# Patient Record
Sex: Female | Born: 1950 | Smoking: Never smoker
Health system: Southern US, Community
[De-identification: ages and names within clinical notes are randomized; demographics above are authoritative.]

## PROBLEM LIST (undated history)

## (undated) DIAGNOSIS — R739 Hyperglycemia, unspecified: Secondary | ICD-10-CM

## (undated) DIAGNOSIS — K5900 Constipation, unspecified: Secondary | ICD-10-CM

## (undated) DIAGNOSIS — M7061 Trochanteric bursitis, right hip: Secondary | ICD-10-CM

## (undated) DIAGNOSIS — F32A Depression, unspecified: Secondary | ICD-10-CM

## (undated) DIAGNOSIS — R5383 Other fatigue: Secondary | ICD-10-CM

## (undated) DIAGNOSIS — N390 Urinary tract infection, site not specified: Secondary | ICD-10-CM

## (undated) DIAGNOSIS — M7071 Other bursitis of hip, right hip: Secondary | ICD-10-CM

## (undated) DIAGNOSIS — I10 Essential (primary) hypertension: Secondary | ICD-10-CM

## (undated) DIAGNOSIS — J302 Other seasonal allergic rhinitis: Secondary | ICD-10-CM

## (undated) DIAGNOSIS — M1990 Unspecified osteoarthritis, unspecified site: Secondary | ICD-10-CM

## (undated) DIAGNOSIS — E119 Type 2 diabetes mellitus without complications: Secondary | ICD-10-CM

## (undated) DIAGNOSIS — M5440 Lumbago with sciatica, unspecified side: Secondary | ICD-10-CM

## (undated) DIAGNOSIS — G4700 Insomnia, unspecified: Secondary | ICD-10-CM

## (undated) DIAGNOSIS — K219 Gastro-esophageal reflux disease without esophagitis: Secondary | ICD-10-CM

## (undated) HISTORY — DX: Lumbago with sciatica, unspecified side: M54.40

## (undated) HISTORY — DX: Urinary tract infection, site not specified: N39.0

## (undated) HISTORY — DX: Gastro-esophageal reflux disease without esophagitis: K21.9

## (undated) HISTORY — DX: Other bursitis of hip, right hip: M70.71

## (undated) HISTORY — DX: Other fatigue: R53.83

## (undated) HISTORY — DX: Hyperglycemia, unspecified: R73.9

## (undated) HISTORY — DX: Other seasonal allergic rhinitis: J30.2

## (undated) HISTORY — DX: Unspecified osteoarthritis, unspecified site: M19.90

## (undated) HISTORY — DX: Insomnia, unspecified: G47.00

## (undated) HISTORY — DX: Depression, unspecified: F32.A

## (undated) HISTORY — DX: Morbid (severe) obesity due to excess calories: E66.01

## (undated) HISTORY — DX: Constipation, unspecified: K59.00

## (undated) HISTORY — DX: Trochanteric bursitis, right hip: M70.61

---

## 1997-12-21 ENCOUNTER — Encounter: Admission: RE | Admit: 1997-12-21 | Discharge: 1997-12-21 | Payer: Self-pay | Admitting: Internal Medicine

## 1997-12-29 ENCOUNTER — Encounter: Admission: RE | Admit: 1997-12-29 | Discharge: 1998-03-29 | Payer: Self-pay | Admitting: Internal Medicine

## 1998-08-04 ENCOUNTER — Encounter: Admission: RE | Admit: 1998-08-04 | Discharge: 1998-08-04 | Payer: Self-pay | Admitting: Family Medicine

## 1998-08-05 ENCOUNTER — Encounter
Admission: RE | Admit: 1998-08-05 | Discharge: 1998-09-07 | Payer: Self-pay | Admitting: Physical Medicine and Rehabilitation

## 1998-09-01 ENCOUNTER — Encounter: Admission: RE | Admit: 1998-09-01 | Discharge: 1998-09-01 | Payer: Self-pay | Admitting: Family Medicine

## 1998-10-11 ENCOUNTER — Encounter: Admission: RE | Admit: 1998-10-11 | Discharge: 1998-10-11 | Payer: Self-pay | Admitting: Sports Medicine

## 1998-10-12 ENCOUNTER — Encounter: Admission: RE | Admit: 1998-10-12 | Discharge: 1998-10-12 | Payer: Self-pay | Admitting: Family Medicine

## 1998-11-09 ENCOUNTER — Encounter: Admission: RE | Admit: 1998-11-09 | Discharge: 1998-11-09 | Payer: Self-pay | Admitting: Family Medicine

## 1998-12-08 ENCOUNTER — Encounter: Admission: RE | Admit: 1998-12-08 | Discharge: 1998-12-08 | Payer: Self-pay | Admitting: Family Medicine

## 2000-06-20 ENCOUNTER — Encounter: Payer: Self-pay | Admitting: Internal Medicine

## 2000-06-20 ENCOUNTER — Encounter: Admission: RE | Admit: 2000-06-20 | Discharge: 2000-06-20 | Payer: Self-pay | Admitting: Internal Medicine

## 2001-06-23 ENCOUNTER — Encounter: Payer: Self-pay | Admitting: *Deleted

## 2001-06-23 ENCOUNTER — Encounter: Admission: RE | Admit: 2001-06-23 | Discharge: 2001-06-23 | Payer: Self-pay | Admitting: *Deleted

## 2002-11-17 ENCOUNTER — Other Ambulatory Visit: Admission: RE | Admit: 2002-11-17 | Discharge: 2002-11-17 | Payer: Self-pay | Admitting: Family Medicine

## 2002-12-11 ENCOUNTER — Ambulatory Visit (HOSPITAL_COMMUNITY): Admission: RE | Admit: 2002-12-11 | Discharge: 2002-12-11 | Payer: Self-pay | Admitting: Family Medicine

## 2011-08-23 ENCOUNTER — Ambulatory Visit: Payer: Self-pay

## 2015-11-08 ENCOUNTER — Other Ambulatory Visit: Payer: Self-pay | Admitting: Physician Assistant

## 2015-11-08 DIAGNOSIS — Z1231 Encounter for screening mammogram for malignant neoplasm of breast: Secondary | ICD-10-CM

## 2015-12-12 ENCOUNTER — Ambulatory Visit
Admission: RE | Admit: 2015-12-12 | Discharge: 2015-12-12 | Disposition: A | Discharge status: Home / Self Care | Payer: BLUE CROSS/BLUE SHIELD | Source: Ambulatory Visit | Attending: Physician Assistant | Admitting: Physician Assistant

## 2015-12-12 DIAGNOSIS — Z1231 Encounter for screening mammogram for malignant neoplasm of breast: Secondary | ICD-10-CM

## 2015-12-15 ENCOUNTER — Other Ambulatory Visit: Payer: Self-pay | Admitting: Physician Assistant

## 2015-12-15 DIAGNOSIS — R928 Other abnormal and inconclusive findings on diagnostic imaging of breast: Secondary | ICD-10-CM

## 2016-01-06 ENCOUNTER — Ambulatory Visit
Admission: RE | Admit: 2016-01-06 | Discharge: 2016-01-06 | Disposition: A | Discharge status: Home / Self Care | Payer: BLUE CROSS/BLUE SHIELD | Source: Ambulatory Visit | Attending: Physician Assistant | Admitting: Physician Assistant

## 2016-01-06 DIAGNOSIS — R928 Other abnormal and inconclusive findings on diagnostic imaging of breast: Secondary | ICD-10-CM

## 2017-04-24 ENCOUNTER — Other Ambulatory Visit: Payer: Self-pay | Admitting: Physician Assistant

## 2017-04-24 DIAGNOSIS — Z1231 Encounter for screening mammogram for malignant neoplasm of breast: Secondary | ICD-10-CM

## 2017-04-25 ENCOUNTER — Ambulatory Visit
Admission: RE | Admit: 2017-04-25 | Discharge: 2017-04-25 | Disposition: A | Discharge status: Home / Self Care | Payer: BLUE CROSS/BLUE SHIELD | Source: Ambulatory Visit | Attending: Physician Assistant | Admitting: Physician Assistant

## 2017-04-25 DIAGNOSIS — Z1231 Encounter for screening mammogram for malignant neoplasm of breast: Secondary | ICD-10-CM

## 2017-08-02 ENCOUNTER — Other Ambulatory Visit: Payer: Self-pay

## 2017-08-02 ENCOUNTER — Encounter (HOSPITAL_COMMUNITY): Payer: Self-pay | Admitting: Emergency Medicine

## 2017-08-02 ENCOUNTER — Ambulatory Visit (HOSPITAL_COMMUNITY)
Admission: EM | Admit: 2017-08-02 | Discharge: 2017-08-02 | Disposition: A | Discharge status: Home / Self Care | Payer: BLUE CROSS/BLUE SHIELD | Attending: Family Medicine | Admitting: Family Medicine

## 2017-08-02 DIAGNOSIS — L03221 Cellulitis of neck: Secondary | ICD-10-CM

## 2017-08-02 HISTORY — DX: Essential (primary) hypertension: I10

## 2017-08-02 HISTORY — DX: Type 2 diabetes mellitus without complications: E11.9

## 2017-08-02 MED ORDER — HYDROCODONE-ACETAMINOPHEN 5-325 MG PO TABS: 1.0000 | ORAL_TABLET | Freq: Every evening | ORAL | 0 refills | Status: AC | PRN

## 2017-08-02 MED ORDER — DOXYCYCLINE HYCLATE 100 MG PO TABS: 100.0000 mg | ORAL_TABLET | Freq: Two times a day (BID) | ORAL | 0 refills | Status: AC

## 2017-08-02 NOTE — Discharge Instructions (Signed)
Continue using hot compresses.  Return in 3 days if the infection is not getting better.

## 2017-08-02 NOTE — ED Provider Notes (Signed)
  [facility]   [id number] 08/02/17 Arrival Time: 1002   SUBJECTIVE:  Theresa Maddox is a 66 y.o. female who presents to the urgent care with complaint of "I cut myself on the back of my head while shaving a week and a half ago, and it filled up with fluid and got tighter and tighter." Pt has abscess on back of her neck.   Past Medical History:  Diagnosis Date  . Diabetes mellitus without complication (HCC)   . Hypertension    No family history on file. Social History   Socioeconomic History  . Marital status: Unknown    Spouse name: Not on file  . Number of children: Not on file  . Years of education: Not on file  . Highest education level: Not on file  Social Needs  . Financial resource strain: Not on file  . Food insecurity - worry: Not on file  . Food insecurity - inability: Not on file  . Transportation needs - medical: Not on file  . Transportation needs - non-medical: Not on file  Occupational History  . Not on file  Tobacco Use  . Smoking status: Never Smoker  Substance and Sexual Activity  . Alcohol use: No    Frequency: Never  . Drug use: Not on file  . Sexual activity: Not on file  Other Topics Concern  . Not on file  Social History Narrative  . Not on file   Current Meds  Medication Sig  . METFORMIN HCL PO Take by mouth.  . METOPROLOL TARTRATE PO Take by mouth.   No Known Allergies    ROS: As per HPI, remainder of ROS negative.   OBJECTIVE:   Vitals:   08/02/17 1018  BP: (!) 160/73  Pulse: 84  Resp: 16  Temp: 97.6 F (36.4 C)  SpO2: 97%     General appearance: alert; no distress Eyes: PERRL; EOMI; conjunctiva normal HENT: normocephalic; atraumatic, external ears normal without trauma; nasal mucosa normal; oral mucosa normal Neck: supple Back: no CVA tenderness Extremities: no cyanosis or edema; symmetrical with no gross deformities Skin: warm and dry.  4 cm nonfluctuant induration with overlying exfoliation at hairline  of occipital region Neurologic: normal gait; grossly normal Psychological: alert and cooperative; normal mood and affect   Labs:  No results found for this or any previous visit.  Labs Reviewed - No data to display  No results found.     ASSESSMENT & PLAN:  1. Cellulitis of neck     Meds ordered this encounter  Medications  . doxycycline (VIBRA-TABS) 100 MG tablet    Sig: Take 1 tablet (100 mg total) by mouth 2 (two) times daily.    Dispense:  20 tablet    Refill:  0  . HYDROcodone-acetaminophen (NORCO) 5-325 MG tablet    Sig: Take 1 tablet by mouth at bedtime as needed for moderate pain.    Dispense:  12 tablet    Refill:  0    Reviewed expectations re: course of current medical issues. Questions answered. Outlined signs and symptoms indicating need for more acute intervention. Patient verbalized understanding. After Visit Summary given.      Elvina SidleLauenstein, Falisha Osment, MD 08/02/17 1044

## 2017-08-02 NOTE — ED Triage Notes (Signed)
Pt states "I cut myself on the back of my head while shaving a week and a half ago, and it filled up with fluid and got tighter and tighter." Pt has abscess on back of her neck.

## 2018-03-31 DIAGNOSIS — G4700 Insomnia, unspecified: Secondary | ICD-10-CM | POA: Diagnosis not present

## 2018-03-31 DIAGNOSIS — I10 Essential (primary) hypertension: Secondary | ICD-10-CM | POA: Diagnosis not present

## 2018-03-31 DIAGNOSIS — E1140 Type 2 diabetes mellitus with diabetic neuropathy, unspecified: Secondary | ICD-10-CM | POA: Diagnosis not present

## 2018-03-31 DIAGNOSIS — E669 Obesity, unspecified: Secondary | ICD-10-CM | POA: Diagnosis not present

## 2018-03-31 DIAGNOSIS — E119 Type 2 diabetes mellitus without complications: Secondary | ICD-10-CM | POA: Diagnosis not present

## 2018-03-31 DIAGNOSIS — E785 Hyperlipidemia, unspecified: Secondary | ICD-10-CM | POA: Diagnosis not present

## 2018-03-31 DIAGNOSIS — E559 Vitamin D deficiency, unspecified: Secondary | ICD-10-CM | POA: Diagnosis not present

## 2018-03-31 DIAGNOSIS — J302 Other seasonal allergic rhinitis: Secondary | ICD-10-CM | POA: Diagnosis not present

## 2018-04-04 DIAGNOSIS — F331 Major depressive disorder, recurrent, moderate: Secondary | ICD-10-CM | POA: Diagnosis not present

## 2018-04-11 DIAGNOSIS — F331 Major depressive disorder, recurrent, moderate: Secondary | ICD-10-CM | POA: Diagnosis not present

## 2018-04-15 DIAGNOSIS — F331 Major depressive disorder, recurrent, moderate: Secondary | ICD-10-CM | POA: Diagnosis not present

## 2018-04-22 DIAGNOSIS — F331 Major depressive disorder, recurrent, moderate: Secondary | ICD-10-CM | POA: Diagnosis not present

## 2018-04-25 DIAGNOSIS — F331 Major depressive disorder, recurrent, moderate: Secondary | ICD-10-CM | POA: Diagnosis not present

## 2018-05-01 DIAGNOSIS — F331 Major depressive disorder, recurrent, moderate: Secondary | ICD-10-CM | POA: Diagnosis not present

## 2018-05-08 DIAGNOSIS — F331 Major depressive disorder, recurrent, moderate: Secondary | ICD-10-CM | POA: Diagnosis not present

## 2018-05-09 DIAGNOSIS — F331 Major depressive disorder, recurrent, moderate: Secondary | ICD-10-CM | POA: Diagnosis not present

## 2018-05-16 DIAGNOSIS — F423 Hoarding disorder: Secondary | ICD-10-CM | POA: Diagnosis not present

## 2018-06-03 DIAGNOSIS — F411 Generalized anxiety disorder: Secondary | ICD-10-CM | POA: Diagnosis not present

## 2018-06-13 DIAGNOSIS — F331 Major depressive disorder, recurrent, moderate: Secondary | ICD-10-CM | POA: Diagnosis not present

## 2018-06-13 DIAGNOSIS — F411 Generalized anxiety disorder: Secondary | ICD-10-CM | POA: Diagnosis not present

## 2018-06-20 DIAGNOSIS — F411 Generalized anxiety disorder: Secondary | ICD-10-CM | POA: Diagnosis not present

## 2018-06-20 DIAGNOSIS — F331 Major depressive disorder, recurrent, moderate: Secondary | ICD-10-CM | POA: Diagnosis not present

## 2018-06-23 DIAGNOSIS — F411 Generalized anxiety disorder: Secondary | ICD-10-CM | POA: Diagnosis not present

## 2018-06-23 DIAGNOSIS — F331 Major depressive disorder, recurrent, moderate: Secondary | ICD-10-CM | POA: Diagnosis not present

## 2018-07-18 DIAGNOSIS — F411 Generalized anxiety disorder: Secondary | ICD-10-CM | POA: Diagnosis not present

## 2018-08-08 DIAGNOSIS — F331 Major depressive disorder, recurrent, moderate: Secondary | ICD-10-CM | POA: Diagnosis not present

## 2018-08-13 ENCOUNTER — Other Ambulatory Visit: Payer: Self-pay | Admitting: Internal Medicine

## 2018-08-13 DIAGNOSIS — Z794 Long term (current) use of insulin: Secondary | ICD-10-CM | POA: Diagnosis not present

## 2018-08-13 DIAGNOSIS — E1165 Type 2 diabetes mellitus with hyperglycemia: Secondary | ICD-10-CM | POA: Diagnosis not present

## 2018-08-13 DIAGNOSIS — Z23 Encounter for immunization: Secondary | ICD-10-CM | POA: Diagnosis not present

## 2018-08-13 DIAGNOSIS — F329 Major depressive disorder, single episode, unspecified: Secondary | ICD-10-CM | POA: Diagnosis not present

## 2018-08-13 DIAGNOSIS — Z0000 Encounter for general adult medical examination without abnormal findings: Secondary | ICD-10-CM | POA: Diagnosis not present

## 2018-08-13 DIAGNOSIS — M810 Age-related osteoporosis without current pathological fracture: Secondary | ICD-10-CM

## 2018-08-13 DIAGNOSIS — Z79899 Other long term (current) drug therapy: Secondary | ICD-10-CM | POA: Diagnosis not present

## 2018-08-13 DIAGNOSIS — Z780 Asymptomatic menopausal state: Secondary | ICD-10-CM | POA: Diagnosis not present

## 2018-08-13 DIAGNOSIS — I10 Essential (primary) hypertension: Secondary | ICD-10-CM | POA: Diagnosis not present

## 2018-08-21 ENCOUNTER — Other Ambulatory Visit: Payer: Self-pay | Admitting: Internal Medicine

## 2018-08-21 DIAGNOSIS — Z13820 Encounter for screening for osteoporosis: Secondary | ICD-10-CM

## 2018-10-08 ENCOUNTER — Ambulatory Visit
Admission: RE | Admit: 2018-10-08 | Discharge: 2018-10-08 | Disposition: A | Discharge status: Home / Self Care | Payer: Medicare HMO | Source: Ambulatory Visit | Attending: Internal Medicine | Admitting: Internal Medicine

## 2018-10-08 ENCOUNTER — Other Ambulatory Visit: Payer: Self-pay | Admitting: Internal Medicine

## 2018-10-08 DIAGNOSIS — Z1231 Encounter for screening mammogram for malignant neoplasm of breast: Secondary | ICD-10-CM

## 2018-10-08 DIAGNOSIS — Z13820 Encounter for screening for osteoporosis: Secondary | ICD-10-CM

## 2020-07-04 ENCOUNTER — Telehealth: Payer: Self-pay

## 2020-07-04 NOTE — Telephone Encounter (Signed)
Patient called and was transferred to my voicemail. She is trying to get an appointment with Dr.Xu. She said that her doctor, Dr.Fred Katrinka Blazing referred her to Dr.Xu for hip pain. She also states that her insurance has approved this referral. I was not sure if I should schedule her or not since I did not see the referral and was not sure if it needed approval from insurance.  Please call patient at #[phone]. Thanks!

## 2020-07-12 ENCOUNTER — Ambulatory Visit: Payer: Medicare HMO | Admitting: Orthopaedic Surgery

## 2020-07-20 ENCOUNTER — Ambulatory Visit: Payer: Medicare HMO | Admitting: Orthopaedic Surgery

## 2020-09-15 ENCOUNTER — Telehealth: Payer: Self-pay | Admitting: *Deleted

## 2020-09-15 NOTE — Telephone Encounter (Signed)
Notes received sent to referral for scheduling. [facility] [phone]   

## 2020-09-29 ENCOUNTER — Ambulatory Visit: Payer: Medicare HMO | Admitting: Internal Medicine

## 2020-10-06 ENCOUNTER — Encounter: Payer: Self-pay | Admitting: General Practice

## 2020-10-10 IMAGING — MG DIGITAL SCREENING BILATERAL MAMMOGRAM WITH TOMO AND CAD
8 series · 8 of 24 positions shown · non-contrast
Comparison: Previous exam(s).

CLINICAL DATA: Screening.

EXAM:
DIGITAL SCREENING BILATERAL MAMMOGRAM WITH TOMO AND CAD

[R CC synth-2D]
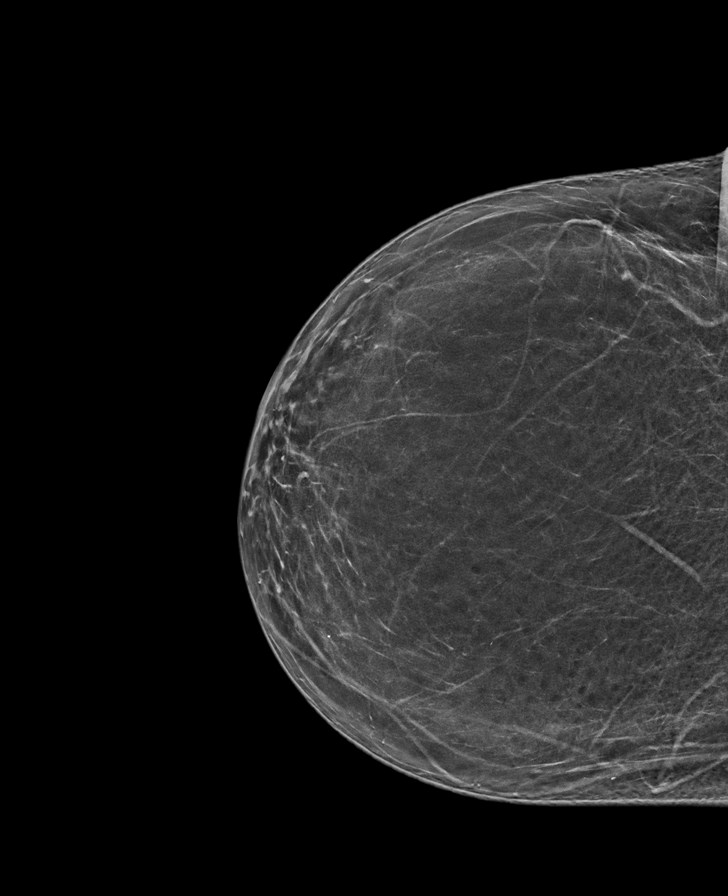

[L CC synth-2D]
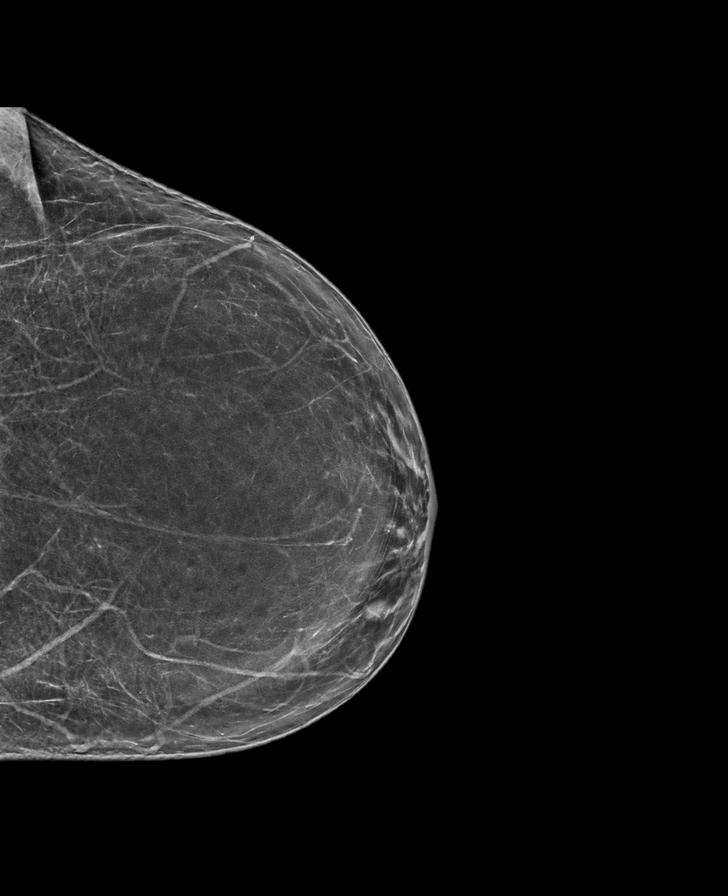

[R MLO synth-2D]
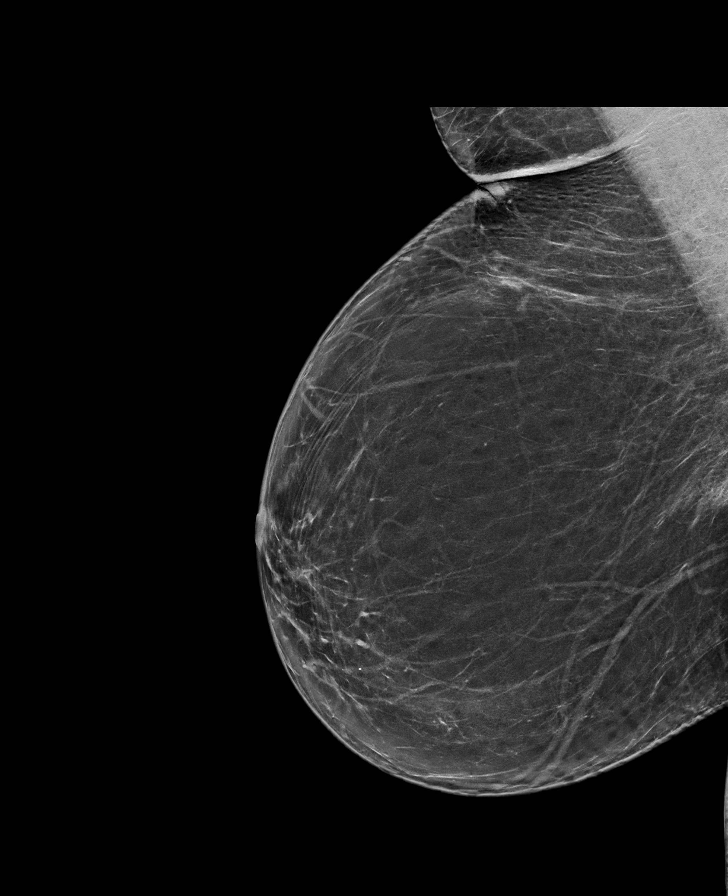

[L MLO synth-2D]
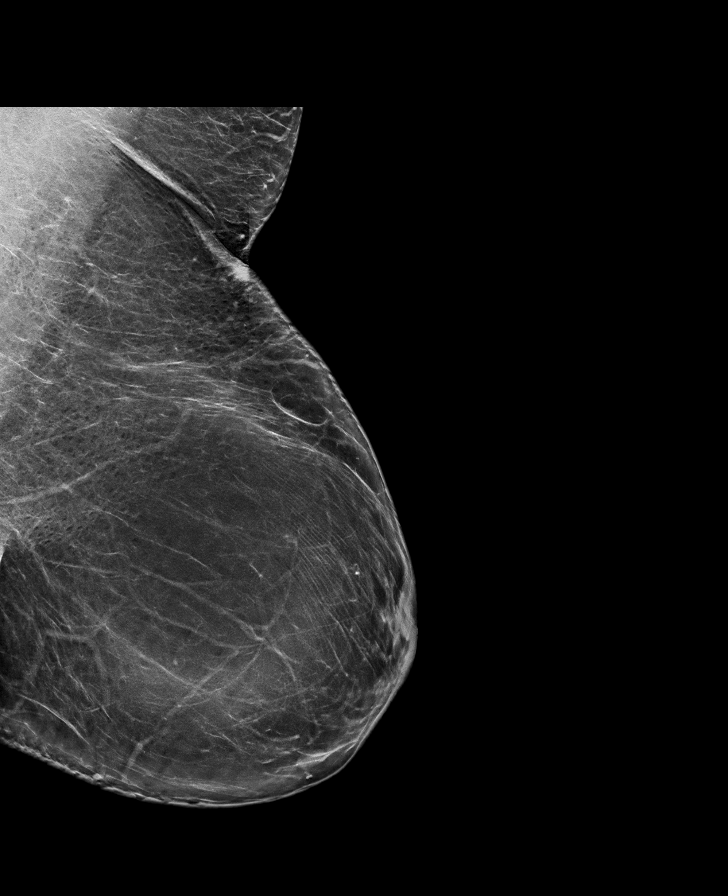

[R CC tomo · tomo slice 35/68.0]
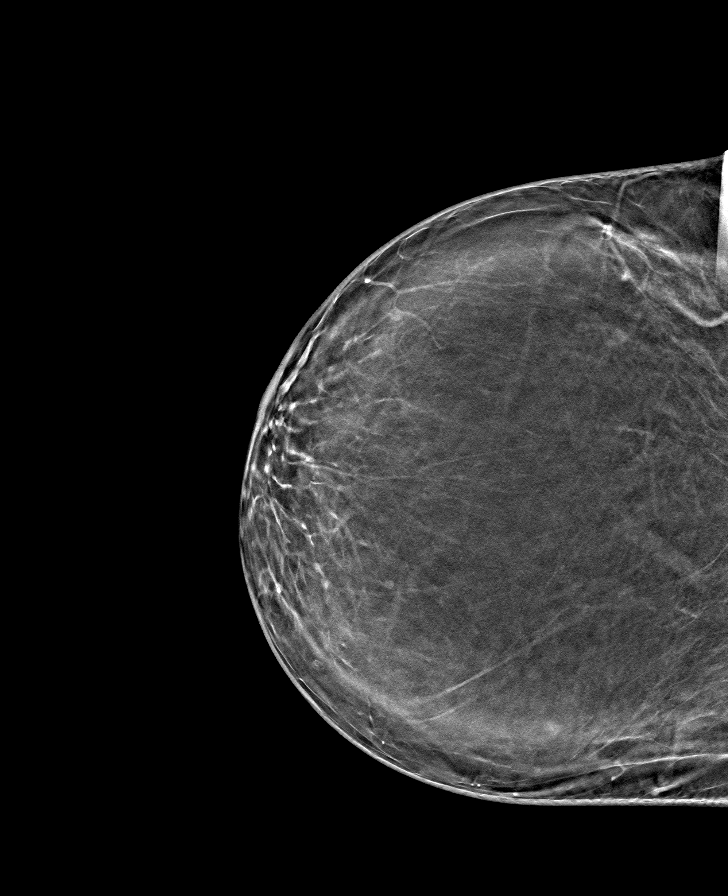

[L MLO tomo · tomo slice 45/90.0]
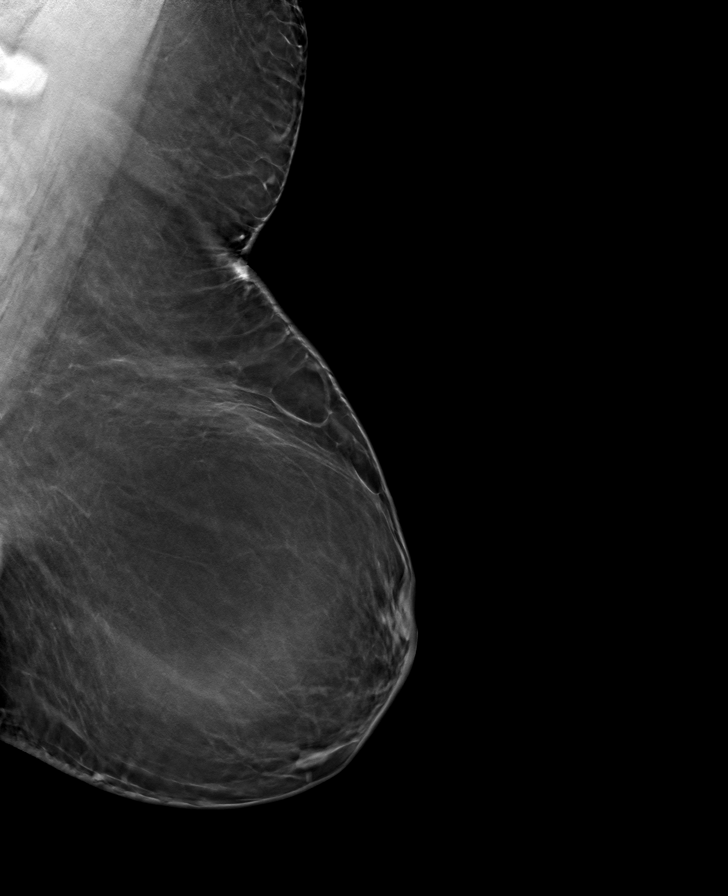

[R MLO tomo · tomo slice 43/84.0]
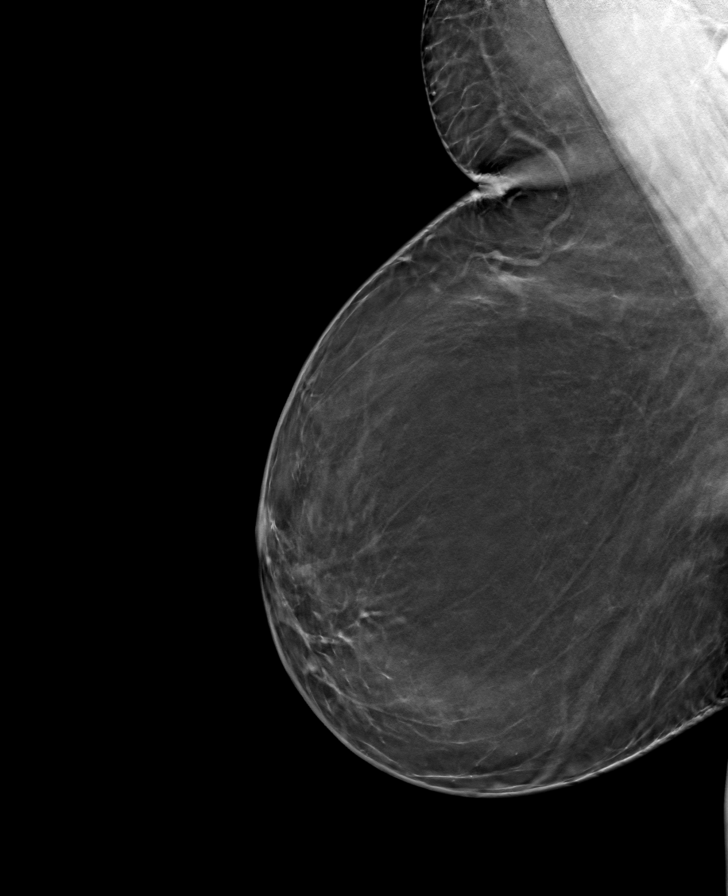

[L CC tomo · tomo slice 35/70.0]
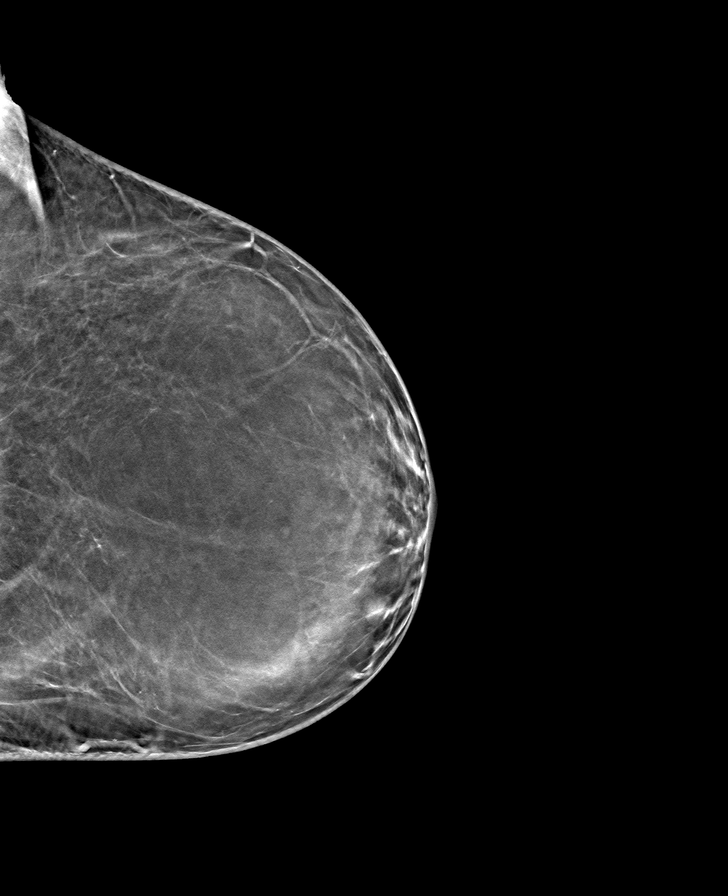

[8 of 24 positions shown; findings below may reference images not displayed]

ACR Breast Density Category b: There are scattered areas of
fibroglandular density.
FINDINGS: There are no findings suspicious for malignancy. Images were
processed with CAD.
IMPRESSION: No mammographic evidence of malignancy. A result letter of this
screening mammogram will be mailed directly to the patient.

RECOMMENDATION:
Screening mammogram in one year. (Code:[id number])

BI-RADS CATEGORY  1: Negative.

## 2020-10-18 ENCOUNTER — Ambulatory Visit: Payer: Medicare HMO | Admitting: Orthopaedic Surgery

## 2020-10-18 ENCOUNTER — Encounter: Payer: Self-pay | Admitting: Orthopaedic Surgery

## 2020-10-18 ENCOUNTER — Ambulatory Visit (INDEPENDENT_AMBULATORY_CARE_PROVIDER_SITE_OTHER): Payer: Medicare HMO

## 2020-10-18 DIAGNOSIS — M25551 Pain in right hip: Secondary | ICD-10-CM | POA: Diagnosis not present

## 2020-10-18 DIAGNOSIS — M5441 Lumbago with sciatica, right side: Secondary | ICD-10-CM | POA: Diagnosis not present

## 2020-10-18 MED ORDER — PREDNISONE 5 MG (21) PO TBPK: ORAL_TABLET | ORAL | 0 refills | Status: AC

## 2020-10-18 MED ORDER — DICLOFENAC SODIUM 75 MG PO TBEC: 75.0000 mg | DELAYED_RELEASE_TABLET | Freq: Two times a day (BID) | ORAL | 0 refills | Status: AC | PRN

## 2020-10-18 NOTE — Progress Notes (Signed)
Office Visit Note   Patient: Theresa Maddox           Date of Birth: Aug 27, 1951           MRN: [id number] Visit Date: 10/18/2020              Requested by: No referring provider defined for this encounter. PCP: Pcp, No   Assessment & Plan: Visit Diagnoses:  1. Pain in right hip   2. Low back pain with right-sided sciatica, unspecified back pain laterality, unspecified chronicity     Plan: Impression is right lower extremity radiculopathy from L5-S1 versus shinsplints.  We have discussed treatment options to include steroids followed by anti-inflammatories as well as applying a foam roller to the shin.  She will follow up with [place] as needed.  Follow-Up Instructions: Return if symptoms worsen or fail to improve.   Orders:  Orders Placed This Encounter  Procedures  . XR HIP UNILAT W OR W/O PELVIS 2-3 VIEWS RIGHT  . XR Lumbar Spine 2-3 Views  . XR Tibia/Fibula Right   Meds ordered this encounter  Medications  . predniSONE (STERAPRED UNI-PAK 21 TAB) 5 MG (21) TBPK tablet    Sig: Take as directed. Do not take at the same time as antiinflammatory medicine    Dispense:  21 tablet    Refill:  0  . diclofenac (VOLTAREN) 75 MG EC tablet    Sig: Take 1 tablet (75 mg total) by mouth 2 (two) times daily as needed. Do not take until finished with the steroid taper    Dispense:  60 tablet    Refill:  0      Procedures: No procedures performed   Clinical Data: No additional findings.   Subjective: Chief Complaint  Patient presents with  . Right Hip - Pain  . Lower Back - Pain    HPI patient is a very pleasant 70 year old female who comes in today with primarily pain to the right lower leg anterior aspect.  This is been ongoing for about a year which has progressively worsened.  No known injury or change in activity.  The pain she has is primarily to the anteromedial shin.  She occasionally notes a knife jabbing sensation.  Otherwise, she describes this as a constant ache.  She  also notes occasional back pain to the right lower side.  She notes slight weakness to the right lower extremity as well.  Her pain is aggravated with standing for long period of time as well as with walking.  She does get relief after she has been sitting for about 5 to 10 minutes.  She has burning sensations to the same area as well.  Review of Systems as detailed in HPI.  All others reviewed and are negative.   Objective: Vital Signs: There were no vitals taken for this visit.  Physical Exam well-developed well-nourished female no acute distress.  Alert and oriented x3.  Ortho Exam lumbar spine reveals no spinous or paraspinous tenderness.  No pain with lumbar flexion or extension.  No pain to the greater trochanter.  Negative logroll negative straight leg raise.  Negative FADIR.  She does have slight tenderness along the anteromedial tibia.  Calf is soft nontender.  She is neurovascular intact distally.  Specialty Comments:  No specialty comments available.  Imaging: XR HIP UNILAT W OR W/O PELVIS 2-3 VIEWS RIGHT  Result Date: 10/18/2020 Moderate joint space narrowing  XR Lumbar Spine 2-3 Views  Result Date: 10/18/2020 Advanced degenerative  changes L5-S1  XR Tibia/Fibula Right  Result Date: 10/18/2020 No acute or structural abnormalities    PMFS History: There are no problems to display for this patient.  Past Medical History:  Diagnosis Date  . Bursitis of right hip   . Constipation   . Depression   . Diabetes mellitus without complication (HCC)   . Fatigue   . GERD (gastroesophageal reflux disease)   . Hyperglycemia   . Hypertension   . Insomnia   . Lumbago with sciatica   . Morbid obesity (HCC)   . Osteoarthritis   . Seasonal allergies   . Trochanteric bursitis of right hip   . UTI (urinary tract infection)     History reviewed. No pertinent family history.  Past Surgical History:  Procedure Laterality Date  . CATARACT EXTRACTION     Social History    Occupational History  . Not on file  Tobacco Use  . Smoking status: Never Smoker  . Smokeless tobacco: Not on file  Substance and Sexual Activity  . Alcohol use: No  . Drug use: Not on file  . Sexual activity: Not on file

## 2020-11-11 ENCOUNTER — Other Ambulatory Visit: Payer: Self-pay | Admitting: Physician Assistant

## 2022-02-19 LAB — COLOGUARD: COLOGUARD: NEGATIVE

## 2023-07-24 ENCOUNTER — Ambulatory Visit: Payer: Medicare HMO | Admitting: Physician Assistant

## 2023-07-24 ENCOUNTER — Telehealth: Payer: Medicare HMO | Admitting: Physician Assistant

## 2023-07-24 ENCOUNTER — Encounter: Payer: Self-pay | Admitting: Physician Assistant

## 2023-07-24 VITALS — BP 149/71 | HR 87 | Ht 65.0 in | Wt 248.0 lb

## 2023-07-24 DIAGNOSIS — Z7985 Long-term (current) use of injectable non-insulin antidiabetic drugs: Secondary | ICD-10-CM

## 2023-07-24 DIAGNOSIS — Z713 Dietary counseling and surveillance: Secondary | ICD-10-CM | POA: Diagnosis not present

## 2023-07-24 DIAGNOSIS — Z794 Long term (current) use of insulin: Secondary | ICD-10-CM | POA: Diagnosis not present

## 2023-07-24 DIAGNOSIS — Z23 Encounter for immunization: Secondary | ICD-10-CM | POA: Diagnosis not present

## 2023-07-24 DIAGNOSIS — Z7984 Long term (current) use of oral hypoglycemic drugs: Secondary | ICD-10-CM | POA: Diagnosis not present

## 2023-07-24 DIAGNOSIS — E1165 Type 2 diabetes mellitus with hyperglycemia: Secondary | ICD-10-CM | POA: Diagnosis not present

## 2023-07-24 NOTE — Progress Notes (Signed)
New Patient Office Visit  Subjective    Patient ID: Theresa Maddox, female    DOB: 1951/07/13  Age: 72 y.o. MRN: [id number]  CC:  Chief Complaint  Patient presents with   Diabetes Management Plan   Weight Loss  Virtual Visit via Video Note  I connected with Theresa Maddox on 07/24/23 at  1:00 PM EST by a video enabled telemedicine application and verified that I am speaking with the correct person using two identifiers.  Did attempt video visit with patient, patient unable to connect properly, patient agreeable to telephone visit.  Patient was seen earlier in the day on the mobile unit for nurse visit for vaccines and vitals were taken during that nurse visit  Location: Patient: Home Provider: [facility] Medicine [facility]    I discussed the limitations of evaluation and management by telemedicine and the availability of in person appointments. The patient expressed understanding and agreed to proceed.  History of Present Illness:  HPI Theresa Maddox states that she would like to work on weight loss and getting a better control of her diabetes.  States that she is seen by [employer], states that her last A1c was 10.  States that she believes this was approximately 3 months ago and states that she is due to be seen by them again, however does not have an upcoming appointment.  States that she is compliant to her insulin regimen.  States that she has been checking her blood glucose levels, states her morning fasting readings have been approximately 200 for the last couple weeks.  States that she is taking Ozempic and is currently using 1 mg.  States that she has not noticed any change in her appetite levels.  States that she was last seen by bariatrics in August.  States that she is switching insurance and is worried that her insurance will not cover the Ozempic.  Does endorse that she feels that she is eating too many carbohydrates and has had an injury which has made it  more difficult for her to exercise.  States that yesterday her breakfast was 2 eggs, states that she did have a snack before lunch, states that she is unsure what it was but that it was definitely just carbohydrates.  Lunch was meat loaf with ketchup, green beans and coleslaw and dinner was a frozen meal of sirloin steak mac & cheese and then she also had some hard candies.  States that she does not drink sugary drinks and does drink plenty of water on a daily basis.  States that yesterday was a typical day of her eating   Observations/Objective: Medical history and current medications reviewed, no physical exam completed     Outpatient Encounter Medications as of 07/24/2023  Medication Sig   aspirin EC 81 MG tablet Take 81 mg by mouth daily. Swallow whole.   atorvastatin (LIPITOR) 20 MG tablet Take 20 mg by mouth daily.   diclofenac (VOLTAREN) 0.1 % ophthalmic solution 4 (four) times daily.   diclofenac (VOLTAREN) 75 MG EC tablet Take 1 tablet (75 mg total) by mouth 2 (two) times daily as needed. Do not take until finished with the steroid taper   doxycycline (VIBRA-TABS) 100 MG tablet Take 1 tablet (100 mg total) by mouth 2 (two) times daily.   escitalopram (LEXAPRO) 10 MG tablet Take 10 mg by mouth daily.   glipiZIDE (GLUCOTROL) 10 MG tablet Take 10 mg by mouth daily before breakfast.   HYDROcodone-acetaminophen (NORCO)  5-325 MG tablet Take 1 tablet by mouth at bedtime as needed for moderate pain.   hydrOXYzine (ATARAX/VISTARIL) 50 MG tablet Take 50 mg by mouth 3 (three) times daily as needed.   insulin NPH-regular Human (70-30) 100 UNIT/ML injection Inject 40 Units into the skin.   insulin NPH-regular Human (70-30) 100 UNIT/ML injection Inject 100 Units into the skin.   insulin regular (NOVOLIN R) 100 units/mL injection Inject into the skin 3 (three) times daily before meals.   lisinopril (ZESTRIL) 40 MG tablet Take 40 mg by mouth daily.   meloxicam (MOBIC) 15 MG tablet Take 15 mg by  mouth daily.   METFORMIN HCL PO Take by mouth.   METOPROLOL TARTRATE PO Take by mouth.   montelukast (SINGULAIR) 10 MG tablet Take 10 mg by mouth at bedtime.   naloxegol oxalate (MOVANTIK) 25 MG TABS tablet Take by mouth daily.   predniSONE (STERAPRED UNI-PAK 21 TAB) 5 MG (21) TBPK tablet Take as directed. Do not take at the same time as antiinflammatory medicine   promethazine-dextromethorphan (PROMETHAZINE-DM) 6.25-15 MG/5ML syrup Take 5 mLs by mouth 4 (four) times daily as needed for cough.   tiZANidine (ZANAFLEX) 2 MG tablet Take by mouth every 6 (six) hours as needed for muscle spasms.   traMADol (ULTRAM) 50 MG tablet Take by mouth every 6 (six) hours as needed.   No facility-administered encounter medications on file as of 07/24/2023.    Past Medical History:  Diagnosis Date   Bursitis of right hip    Constipation    Depression    Diabetes mellitus without complication (HCC)    Fatigue    GERD (gastroesophageal reflux disease)    Hyperglycemia    Hypertension    Insomnia    Lumbago with sciatica    Morbid obesity (HCC)    Osteoarthritis    Seasonal allergies    Trochanteric bursitis of right hip    UTI (urinary tract infection)     Past Surgical History:  Procedure Laterality Date   CATARACT EXTRACTION      History reviewed. No pertinent family history.  Social History   Socioeconomic History   Marital status: Unknown    Spouse name: Not on file   Number of children: Not on file   Years of education: Not on file   Highest education level: Not on file  Occupational History   Not on file  Tobacco Use   Smoking status: Never   Smokeless tobacco: Not on file  Substance and Sexual Activity   Alcohol use: No   Drug use: Not on file   Sexual activity: Not on file  Other Topics Concern   Not on file  Social History Narrative   Not on file   Social Determinants of Health   Financial Resource Strain: High Risk (04/14/2023)   Received from [facility]    Overall Financial Resource Strain (CARDIA)    Difficulty of Paying Living Expenses: Very hard  Food Insecurity: No Food Insecurity (04/14/2023)   [facility]   Hunger Vital Sign    Worried About Running Out of Food in the Last Year: Never true    Ran Out of Food in the Last Year: Never true  Transportation Needs: No Transportation Needs (04/14/2023)   Received from [facility] - Transportation    Lack of Transportation (Medical): No    Lack of Transportation (Non-Medical): No  Physical Activity: Insufficiently Active (04/14/2023)   Received from [facility]  Exercise Vital Sign    Days of Exercise per Week: 3 days    Minutes of Exercise per Session: 20 min  Stress: Stress Concern Present (04/14/2023)   Received from [facility] - Occupational Stress Questionnaire    Feeling of Stress : Very much  Social Connections: Somewhat Isolated (04/14/2023)   Received from [facility]   Social Network    How would you rate your social network (family, work, friends)?: Restricted participation with some degree of social isolation  Intimate Partner Violence: Not At Risk (04/14/2023)   Received from [facility]   HITS    Over the last 12 months how often did your partner physically hurt you?: Never    Over the last 12 months how often did your partner insult you or talk down to you?: Never    Over the last 12 months how often did your partner threaten you with physical harm?: Never    Over the last 12 months how often did your partner scream or curse at you?: Never    Review of Systems  Constitutional: Negative.   HENT: Negative.    Eyes: Negative.   Respiratory:  Negative for shortness of breath.   Cardiovascular:  Negative for chest pain.  Gastrointestinal: Negative.   Genitourinary: Negative.   Musculoskeletal: Negative.   Skin: Negative.   Neurological: Negative.   Endo/Heme/Allergies: Negative.    Psychiatric/Behavioral: Negative.          Objective    BP (!) 149/71 (BP Location: Left Arm, Patient Position: Sitting, Cuff Size: Large)   Pulse 87   Ht 5\' 5"  (1.651 m)   Wt 248 lb (112.5 kg)   SpO2 95%   BMI 41.27 kg/m         Assessment & Plan:   Problem List Items Addressed This Visit   None Visit Diagnoses     Type 2 diabetes mellitus with hyperglycemia, with long-term current use of insulin (HCC)    -  Primary   Relevant Orders   Ambulatory referral to Endocrinology   Weight loss counseling, encounter for       Encounter for long-term (current) use of insulin (HCC)          Assessment and Plan: 1. Type 2 diabetes mellitus with hyperglycemia, with long-term current use of insulin (HCC) Patient would like to be seen by endocrinology for further evaluation.  Patient encouraged to continue follow-up with her primary care provider.  Patient encouraged to return to mobile unit as needed.  Patient encouraged to continue checking blood glucose levels at home, keeping a written log and having available for all office visits. - Ambulatory referral to Endocrinology  2. Weight loss counseling, encounter for Patient education given on mindful eating, reducing simple carbohydrates  3. Encounter for long-term (current) use of insulin (HCC) .  Follow Up Instructions:    I discussed the assessment and treatment plan with the patient. The patient was provided an opportunity to ask questions and all were answered. The patient agreed with the plan and demonstrated an understanding of the instructions.   The patient was advised to call back or seek an in-person evaluation if the symptoms worsen or if the condition fails to improve as anticipated.  I provided 25 minutes of non-face-to-face time during this encounter. No AVS created, patient declines access to MyChart.  Patient education given through teach back method     Return if symptoms worsen or fail to improve.  Kasandra Knudsen Mayers, PA-C

## 2023-09-04 ENCOUNTER — Other Ambulatory Visit: Payer: Self-pay | Admitting: Family

## 2023-09-04 DIAGNOSIS — Z1231 Encounter for screening mammogram for malignant neoplasm of breast: Secondary | ICD-10-CM

## 2024-08-17 ENCOUNTER — Other Ambulatory Visit: Payer: Self-pay | Admitting: Orthopedic Surgery

## 2024-08-17 DIAGNOSIS — M5416 Radiculopathy, lumbar region: Secondary | ICD-10-CM
